# Patient Record
Sex: Male | Born: 1985 | Race: White | Hispanic: No | Marital: Married | Smoking: Never smoker
Health system: Southern US, Community
[De-identification: ages and names within clinical notes are randomized; demographics above are authoritative.]

## PROBLEM LIST (undated history)

## (undated) DIAGNOSIS — K5090 Crohn's disease, unspecified, without complications: Secondary | ICD-10-CM

## (undated) HISTORY — PX: ILEOCECETOMY: SHX5857

---

## 2012-03-13 ENCOUNTER — Other Ambulatory Visit: Payer: Self-pay | Admitting: Occupational Medicine

## 2012-03-13 ENCOUNTER — Ambulatory Visit (HOSPITAL_BASED_OUTPATIENT_CLINIC_OR_DEPARTMENT_OTHER)
Admission: RE | Admit: 2012-03-13 | Discharge: 2012-03-13 | Disposition: A | Discharge status: Home / Self Care | Payer: Self-pay | Source: Ambulatory Visit | Attending: Occupational Medicine | Admitting: Occupational Medicine

## 2012-03-13 DIAGNOSIS — Z0000 Encounter for general adult medical examination without abnormal findings: Secondary | ICD-10-CM | POA: Insufficient documentation

## 2013-01-20 IMAGING — CR DG CHEST 1V
1 series · 1 of 1 positions shown · non-contrast
Comparison: None

CLINICAL DATA: Physical exam.

CHEST - 1 VIEW

[w chest pa]
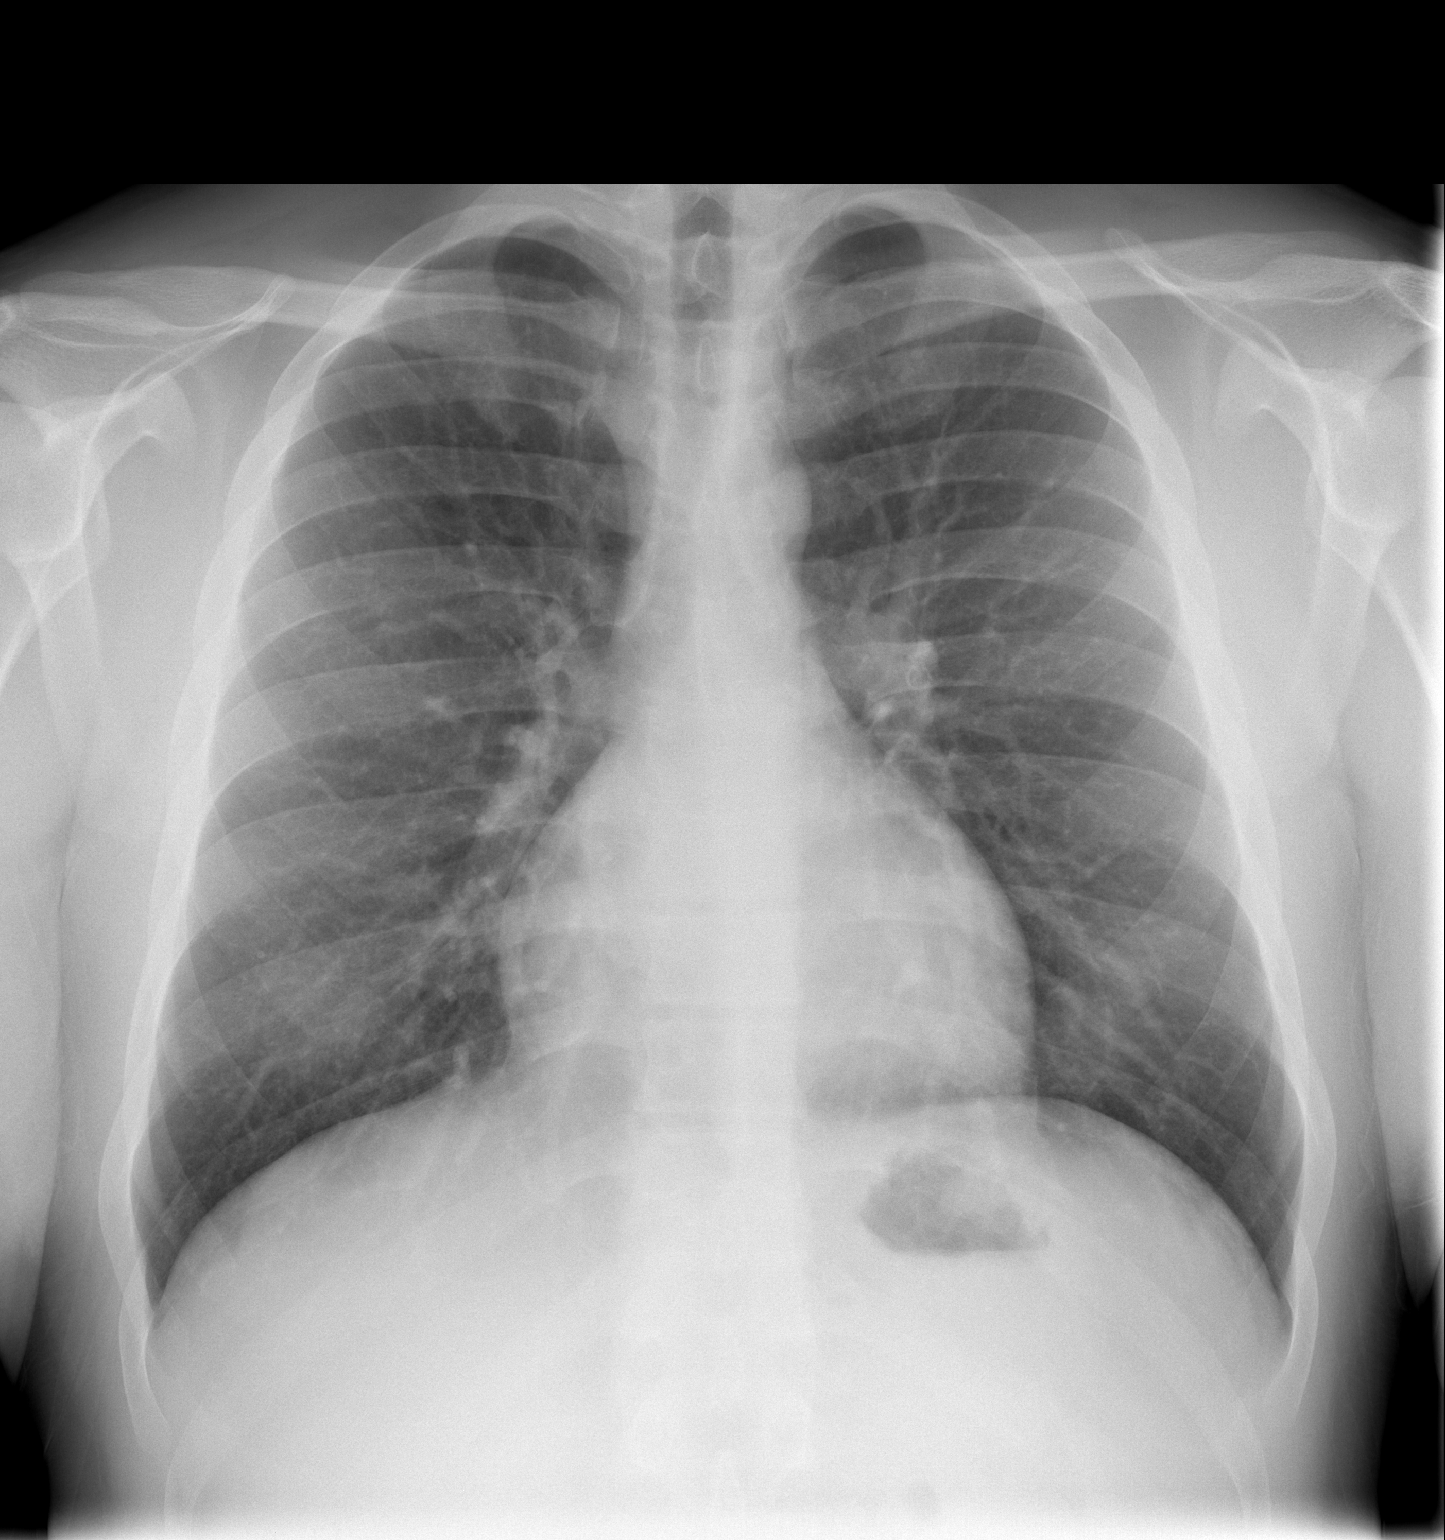

[1 of 1 positions shown; findings below may reference images not displayed]

FINDINGS: Heart and mediastinal contours are within normal limits.
No focal opacities or effusions.  No acute bony abnormality.
IMPRESSION: No active cardiopulmonary disease.

## 2014-07-23 ENCOUNTER — Encounter: Payer: Self-pay | Admitting: Emergency Medicine

## 2014-07-23 ENCOUNTER — Emergency Department
Admission: EM | Admit: 2014-07-23 | Discharge: 2014-07-23 | Disposition: A | Discharge status: Home / Self Care | Payer: BLUE CROSS/BLUE SHIELD | Source: Home / Self Care | Attending: Family Medicine | Admitting: Family Medicine

## 2014-07-23 DIAGNOSIS — J069 Acute upper respiratory infection, unspecified: Secondary | ICD-10-CM

## 2014-07-23 HISTORY — DX: Crohn's disease, unspecified, without complications: K50.90

## 2014-07-23 MED ORDER — GUAIFENESIN-CODEINE 100-10 MG/5ML PO SOLN: 5.0000 mL | Freq: Every evening | ORAL | Status: DC | PRN

## 2014-07-23 MED ORDER — PREDNISONE 10 MG PO TABS: 30.0000 mg | ORAL_TABLET | Freq: Every day | ORAL | Status: DC

## 2014-07-23 MED ORDER — IPRATROPIUM BROMIDE 0.06 % NA SOLN: 2.0000 | Freq: Four times a day (QID) | NASAL | Status: DC

## 2014-07-23 NOTE — ED Provider Notes (Signed)
Brandon Murphy is a 29 y.o. male who presents to [name] today for cough congestion sure throat and runny nose. No shortness of breath. Symptoms present for 4 days. Symptoms consistent with previous episodes of bronchitis. Patient is mildly immunocompromised due to Brandon used to treat Crohn's disease. Typically prednisone is use to alleviate his right otitis that he gets yearly. No wheezing or chest pain palpitations nausea vomiting or diarrhea. Cough is bothersome especially at bedtime.   Past Medical History  Diagnosis Date  . Crohn disease    History reviewed. No pertinent past surgical history. History  Substance Use Topics  . Smoking status: Never Smoker   . Smokeless tobacco: Not on file  . Alcohol Use: No   ROS as above Medications: No current facility-administered medications for this encounter.   Current Outpatient Prescriptions  Medication Sig Dispense Refill  . Certolizumab Pegol (CIMZIA Murphy) Inject into the skin.    Marland Kitchen. omeprazole (PRILOSEC) 10 MG capsule Take 10 mg by mouth daily.    Marland Kitchen. guaiFENesin-codeine 100-10 MG/5ML syrup Take 5 mLs by mouth at bedtime as needed for cough. 120 mL 0  . ipratropium (ATROVENT) 0.06 % nasal spray Place 2 sprays into both nostrils 4 (four) times daily. 15 mL 1  . predniSONE (DELTASONE) 10 MG tablet Take 3 tablets (30 mg total) by mouth daily. 15 tablet 0   Allergies  Allergen Reactions  . Pertussis Vaccines      Exam:  BP 134/78 mmHg  Pulse 73  Temp(Src) 97.4 F (36.3 C) (Oral)  Wt 228 lb (103.42 kg)  SpO2 96% Gen: Well NAD HEENT: EOMI,  MMM posterior pharynx with cobblestoning. Normal tympanic membranes bilaterally. Lungs: Normal work of breathing. CTABL Heart: RRR no MRG Abd: NABS, Soft. Nondistended, Nontender Exts: Brisk capillary refill, warm and well perfused.   No results found for this or any previous visit (from the past 24 hour(s)). No results found.  Assessment and Plan: 29 y.o. male with bronchitis treat  with prednisone and Atrovent nasal spray and codeine cough medication. Follow-up as needed.  Discussed warning signs or symptoms. Please see discharge instructions. Patient expresses understanding.     Rodolph BongEvan S Corey, MD 07/23/14 [phone]

## 2014-07-23 NOTE — ED Notes (Signed)
Pt c/o cough and post nasal drip x4 days. States cough is productive. Has tried Dayquil with minimal relief.

## 2014-07-23 NOTE — Discharge Instructions (Signed)
Thank you for coming in today. °Call or go to the emergency room if you get worse, have trouble breathing, have chest pains, or palpitations.  ° ° °Cough, Adult ° A cough is a reflex that helps clear your throat and airways. It can help heal the body or may be a reaction to an irritated airway. A cough may only last 2 or 3 weeks (acute) or may last more than 8 weeks (chronic).  °CAUSES °Acute cough: °· Viral or bacterial infections. °Chronic cough: °· Infections. °· Allergies. °· Asthma. °· Post-nasal drip. °· Smoking. °· Heartburn or acid reflux. °· Some medicines. °· Chronic lung problems (COPD). °· Cancer. °SYMPTOMS  °· Cough. °· Fever. °· Chest pain. °· Increased breathing rate. °· High-pitched whistling sound when breathing (wheezing). °· Colored mucus that you cough up (sputum). °TREATMENT  °· A bacterial cough may be treated with antibiotic medicine. °· A viral cough must run its course and will not respond to antibiotics. °· Your caregiver may recommend other treatments if you have a chronic cough. °HOME CARE INSTRUCTIONS  °· Only take over-the-counter or prescription medicines for pain, discomfort, or fever as directed by your caregiver. Use cough suppressants only as directed by your caregiver. °· Use a cold steam vaporizer or humidifier in your bedroom or home to help loosen secretions. °· Sleep in a semi-upright position if your cough is worse at night. °· Rest as needed. °· Stop smoking if you smoke. °SEEK IMMEDIATE MEDICAL CARE IF:  °· You have pus in your sputum. °· Your cough starts to worsen. °· You cannot control your cough with suppressants and are losing sleep. °· You begin coughing up blood. °· You have difficulty breathing. °· You develop pain which is getting worse or is uncontrolled with medicine. °· You have a fever. °MAKE SURE YOU:  °· Understand these instructions. °· Will watch your condition. °· Will get help right away if you are not doing well or get worse. °Document Released:  12/18/2010 Document Revised: 09/13/2011 Document Reviewed: 12/18/2010 °[employer]® Patient Information ©2015 [employer]. This information is not intended to replace advice given to you by your health care provider. Make sure you discuss any questions you have with your health care provider. ° °

## 2014-10-05 ENCOUNTER — Encounter: Payer: Self-pay | Admitting: Emergency Medicine

## 2014-10-05 ENCOUNTER — Emergency Department
Admission: EM | Admit: 2014-10-05 | Discharge: 2014-10-05 | Disposition: A | Discharge status: Home / Self Care | Payer: BLUE CROSS/BLUE SHIELD | Source: Home / Self Care | Attending: Emergency Medicine | Admitting: Emergency Medicine

## 2014-10-05 DIAGNOSIS — J302 Other seasonal allergic rhinitis: Secondary | ICD-10-CM | POA: Diagnosis not present

## 2014-10-05 DIAGNOSIS — H1013 Acute atopic conjunctivitis, bilateral: Secondary | ICD-10-CM | POA: Diagnosis not present

## 2014-10-05 DIAGNOSIS — J309 Allergic rhinitis, unspecified: Secondary | ICD-10-CM

## 2014-10-05 MED ORDER — PREDNISONE 10 MG PO TABS: ORAL_TABLET | ORAL | Status: DC

## 2014-10-05 MED ORDER — METHYLPREDNISOLONE ACETATE 80 MG/ML IJ SUSP
80.0000 mg | Freq: Once | INTRAMUSCULAR | Status: AC
  Administered 2014-10-05: 80 mg via INTRAMUSCULAR

## 2014-10-05 NOTE — ED Notes (Signed)
Pt c/o severe seasonal allergies. He uses steroid eye drops and claritin with minimal relief.

## 2014-10-05 NOTE — ED Provider Notes (Signed)
CSN: [id number]     Arrival date & time 10/05/14  1049 History   First MD Initiated Contact with Patient 10/05/14 1119     Chief Complaint  Patient presents with  . Allergies   (Consider location/radiation/quality/duration/timing/severity/associated sxs/prior Treatment) HPI Patient complains of severe seasonal allergies with itchy watery eyes swelling in his face with itch, postnasal drainage and nonproductive cough. No breathing problems. He's not sure if these had wheezing but currently denies wheezing. Complains of sneezing and nasal congestion and serous nasal drainage. No vision change. Has tried Patanol eyedrops and Claritin and that only helped minimally. He states that he usually needs a steroid shot and oral prednisone for 12 days to resolve severe seasonal allergy flareup. Of note, pollen counts are extremely high this week. Past Medical History  Diagnosis Date  . Crohn disease    History reviewed. No pertinent past surgical history. Family History  Problem Relation Age of Onset  . Hypertension Mother   . CAD Mother    History  Substance Use Topics  . Smoking status: Never Smoker   . Smokeless tobacco: Not on file  . Alcohol Use: No    Review of Systems  All other systems reviewed and are negative.   Allergies  Pertussis vaccines  Home Medications   Prior to Admission medications   Medication Sig Start Date End Date Taking? Authorizing Provider  Certolizumab Pegol (CIMZIA [place]) Inject into the skin.    Historical Provider, MD  guaiFENesin-codeine 100-10 MG/5ML syrup Take 5 mLs by mouth at bedtime as needed for cough. 07/23/14   Rodolph Bong, MD  ipratropium (ATROVENT) 0.06 % nasal spray Place 2 sprays into both nostrils 4 (four) times daily. 07/23/14   Rodolph Bong, MD  omeprazole (PRILOSEC) 10 MG capsule Take 10 mg by mouth daily.    Historical Provider, MD  predniSONE (DELTASONE) 10 MG tablet Days 1 & 2, take 6 daily. Days 3 & 4, take 5 daily. Days 5 & 6: 4  daily. Days 7 &8: 3 daily. Days 9 &10: 2 daily. Days 11 &12: 1 daily. 10/05/14   Lajean Manes, MD   BP 100/66 mmHg  Pulse 76  Temp(Src) 97.8 F (36.6 C) (Oral)  Wt 230 lb (104.327 kg)  SpO2 98% Physical Exam  Constitutional: He is oriented to person, place, and time. He appears well-developed and well-nourished. No distress.  Face is mildly erythematous and minimal diffuse swelling. No lip swelling. Oropharynx clear, airway intact.  HENT:  Head: Normocephalic and atraumatic.  TMs normal. Nose with pale boggy terminates serous drainage. Pharynx with lymphoid hyperplasia and serous postnasal drainage.  Eyes: EOM are normal. Pupils are equal, round, and reactive to light. No scleral icterus.  Conjunctiva mildly inflamed without exudate. Visual acuity grossly intact bilaterally  Neck: Normal range of motion. Neck supple. No JVD present. No tracheal deviation present.  Cardiovascular: Normal rate, regular rhythm and normal heart sounds.   No murmur heard. Pulmonary/Chest: Effort normal and breath sounds normal. No stridor. He has no wheezes.  Abdominal: He exhibits no distension and no mass. There is no tenderness. There is no rebound and no guarding.  Musculoskeletal: Normal range of motion. He exhibits no tenderness.  Lymphadenopathy:    He has no cervical adenopathy.  Neurological: He is alert and oriented to person, place, and time. No cranial nerve deficit.  Skin: Skin is warm. No rash noted.  Psychiatric: He has a normal mood and affect.  Nursing note and vitals reviewed.  ED Course  Procedures (including critical care time) Labs Review Labs Reviewed - No data to display  Imaging Review No results found.   MDM   1. Seasonal allergies   2. Allergic conjunctivitis and rhinitis, bilateral    severe. No sign of infection  Risks, benefits, alternatives discussed. Treatment options discussed, as well as risks, benefits, alternatives. Patient voiced understanding and  agreement with the following plans: New Prescriptions   PREDNISONE (DELTASONE) 10 MG TABLET    Days 1 & 2, take 6 daily. Days 3 & 4, take 5 daily. Days 5 & 6: 4 daily. Days 7 &8: 3 daily. Days 9 &10: 2 daily. Days 11 &12: 1 daily.   Depo-Medrol 80 mg IM stat Follow-up with your allergist and/or primary care doctor in 5-7 days if not improving, or sooner if symptoms become worse. Precautions discussed. Red flags discussed. Questions invited and answered. Patient voiced understanding and agreement.    Lajean Manesavid Massey, MD 10/05/14 [phone]

## 2015-03-18 ENCOUNTER — Other Ambulatory Visit: Payer: Self-pay | Admitting: Family Medicine

## 2017-06-15 ENCOUNTER — Telehealth: Payer: Self-pay | Admitting: *Deleted

## 2017-06-15 ENCOUNTER — Emergency Department
Admission: EM | Admit: 2017-06-15 | Discharge: 2017-06-15 | Disposition: A | Discharge status: Home / Self Care | Payer: Managed Care, Other (non HMO) | Source: Home / Self Care | Attending: Family Medicine | Admitting: Family Medicine

## 2017-06-15 ENCOUNTER — Encounter: Payer: Self-pay | Admitting: *Deleted

## 2017-06-15 ENCOUNTER — Other Ambulatory Visit: Payer: Self-pay

## 2017-06-15 DIAGNOSIS — H6502 Acute serous otitis media, left ear: Secondary | ICD-10-CM | POA: Diagnosis not present

## 2017-06-15 DIAGNOSIS — J302 Other seasonal allergic rhinitis: Secondary | ICD-10-CM

## 2017-06-15 DIAGNOSIS — J069 Acute upper respiratory infection, unspecified: Secondary | ICD-10-CM

## 2017-06-15 DIAGNOSIS — B9789 Other viral agents as the cause of diseases classified elsewhere: Secondary | ICD-10-CM

## 2017-06-15 MED ORDER — METHYLPREDNISOLONE SODIUM SUCC 125 MG IJ SOLR
80.0000 mg | Freq: Once | INTRAMUSCULAR | Status: AC
  Administered 2017-06-15: 80 mg via INTRAMUSCULAR

## 2017-06-15 MED ORDER — PREDNISONE 20 MG PO TABS
ORAL_TABLET | ORAL | 0 refills | Status: DC
Start: 2017-06-15 — End: 2018-06-18

## 2017-06-15 MED ORDER — DOXYCYCLINE HYCLATE 100 MG PO CAPS: 100.0000 mg | ORAL_CAPSULE | Freq: Two times a day (BID) | ORAL | 0 refills | Status: DC

## 2017-06-15 MED ORDER — BECLOMETHASONE DIPROPIONATE 80 MCG/ACT NA AERS: INHALATION_SPRAY | NASAL | 1 refills | Status: AC

## 2017-06-15 MED ORDER — GUAIFENESIN-CODEINE 100-10 MG/5ML PO SOLN: ORAL | 0 refills | Status: AC

## 2017-06-15 MED ORDER — IPRATROPIUM BROMIDE 0.03 % NA SOLN: NASAL | 2 refills | Status: AC

## 2017-06-15 NOTE — Discharge Instructions (Signed)
Begin prednisone Thursday 06/16/17. Take plain guaifenesin (1200mg  extended release tabs such as Mucinex) twice daily, with plenty of water, for cough and congestion.  May add Pseudoephedrine (30mg , one or two every 4 to 6 hours) for sinus congestion.  Get adequate rest.   May use Afrin nasal spray (or generic oxymetazoline) each morning for about 5 days and then discontinue.  Also recommend using saline nasal spray several times daily and saline nasal irrigation (AYR is a common brand).  Use QNASL nasal spray each morning after using Afrin nasal spray and saline nasal irrigation. Try warm salt water gargles for sore throat.  Stop all antihistamines for now, and other non-prescription cough/cold preparations.

## 2017-06-15 NOTE — Telephone Encounter (Signed)
Received a fax from pts pharmacy stating his rx for Qnasal was rejected by his insurance. Called to inform the pt per dr Cathren Harshbeese he should get OTC Flonase instead. Pt verbalized understanding.

## 2017-06-15 NOTE — ED Triage Notes (Signed)
Pt c/o nasal congestion, runny nose and productive cough x 1 wk. Denies fever.

## 2017-06-15 NOTE — ED Provider Notes (Signed)
[name]    CSN: [id number] Arrival date & time: 06/15/17  1116     History   Chief Complaint Chief Complaint  Patient presents with  . Nasal Congestion    HPI Brandon Murphy is a 31 y.o. male.   Patient has a history of perennial rhinitis.  His usual sinus congestion became worse about one week ago with development of sore throat, increased sinus pressure, ear fullness, and non-productive cough worse at night.  He has had chills and mild myalgias. He has a history of recurring sinusitis.   The history is provided by the patient.    Past Medical History:  Diagnosis Date  . Crohn disease (HCC)     There are no active problems to display for this patient.   Past Surgical History:  Procedure Laterality Date  . ILEOCECETOMY         Home Medications    Prior to Admission medications   Medication Sig Start Date End Date Taking? Authorizing Provider  inFLIXimab (REMICADE) 100 MG injection Inject into the vein.   Yes [provider]  methotrexate (RHEUMATREX) 2.5 MG tablet Take 2.5 mg by mouth once a week. Caution:Chemotherapy. Protect from light.   Yes [provider]  omeprazole (PRILOSEC) 10 MG capsule Take 10 mg by mouth daily.   Yes [provider]  Beclomethasone Dipropionate (QNASL) 80 MCG/ACT AERS Place 2 sprays in each nostril once daily 06/15/17   Lattie Haw, MD  doxycycline (VIBRAMYCIN) 100 MG capsule Take 1 capsule (100 mg total) by mouth 2 (two) times daily. Take with food. 06/15/17   Lattie Haw, MD  guaiFENesin-codeine 100-10 MG/5ML syrup Take 10mL by mouth at bedtime as needed for cough.  May repeat dose in 4 to 6 hours. 06/15/17   Lattie Haw, MD  ipratropium (ATROVENT) 0.03 % nasal spray Place 2 sprays in each nostril BID 06/15/17   Lattie Haw, MD  predniSONE (DELTASONE) 20 MG tablet Take one tab by mouth twice daily for 5 days, then one daily for 5 days. Take with food. 06/15/17    Lattie Haw, MD    Family History Family History  Problem Relation Age of Onset  . Hypertension Mother   . CAD Mother     Social History Social History   Tobacco Use  . Smoking status: Never Smoker  . Smokeless tobacco: Never Used  Substance Use Topics  . Alcohol use: No  . Drug use: No     Allergies   Pertussis vaccines   Review of Systems Review of Systems + sore throat + cough No pleuritic pain No wheezing + nasal congestion + post-nasal drainage + sinus pain/pressure No itchy/red eyes ? earache No hemoptysis No SOB No fever, + chills No nausea No vomiting No abdominal pain No diarrhea No urinary symptoms No skin rash + fatigue + myalgias + headache Used OTC meds without relief   Physical Exam Triage Vital Signs ED Triage Vitals  Enc Vitals Group     BP 06/15/17 1137 (!) 165/82     Pulse Rate 06/15/17 1137 70     Resp 06/15/17 1137 16     Temp 06/15/17 1137 98.1 F (36.7 C)     Temp Source 06/15/17 1137 Oral     SpO2 06/15/17 1137 99 %     Weight 06/15/17 1137 223 lb (101.2 kg)     Height 06/15/17 1137 6' (1.829 m)     Head Circumference --  Peak Flow --      Pain Score 06/15/17 1138 0     Pain Loc --      Pain Edu? --      Excl. in GC? --    No data found.  Updated Vital Signs BP (!) 165/82 (BP Location: Right Arm)   Pulse 70   Temp 98.1 F (36.7 C) (Oral)   Resp 16   Ht 6' (1.829 m)   Wt 223 lb (101.2 kg)   SpO2 99%   BMI 30.24 kg/m   Visual Acuity Right Eye Distance:   Left Eye Distance:   Bilateral Distance:    Right Eye Near:   Left Eye Near:    Bilateral Near:     Physical Exam Nursing notes and Vital Signs reviewed. Appearance:  Patient appears stated age, and in no acute distress Eyes:  Pupils are equal, round, and reactive to light and accomodation.  Extraocular movement is intact.  Conjunctivae are not inflamed  Ears:  Canals normal.  Right tympanic membrane normal; left tympanic membrane has  serous effusion. Nose:  Congested turbinates.    Maxillary and frontal sinus tenderness is present.  Pharynx:  Normal Neck:  Supple.  Enlarged posterior/lateral nodes are palpated bilaterally, tender to palpation on the left.   Lungs:  Clear to auscultation.  Breath sounds are equal.  Moving air well. Heart:  Regular rate and rhythm without murmurs, rubs, or gallops.  Abdomen:  Nontender without masses or hepatosplenomegaly.  Bowel sounds are present.  No CVA or flank tenderness.  Extremities:  No edema.  Skin:  No rash present.    UC Treatments / Results  Labs (all labs ordered are listed, but only abnormal results are displayed) Labs Reviewed - No data to display  EKG  EKG Interpretation None       Radiology No results found.  Procedures Procedures (including critical care time)  Medications Ordered in UC Medications  methylPREDNISolone sodium succinate (SOLU-MEDROL) 125 mg/2 mL injection 80 mg (80 mg Intramuscular Given 06/15/17 1154)     Initial Impression / Assessment and Plan / UC Course  I have reviewed the triage vital signs and the nursing notes.  Pertinent labs & imaging results that were available during my care of the patient were reviewed by me and considered in my medical decision making (see chart for details).    Patient now has congestion of a viral URI superimposed on his usual seasonal and perennial rhinitis.    Administered Solumedrol 80mg  IM Begin empiric doxycycline 100mg  BID. Refil QNASL and Atrovent nasal sprays. Rx for Robitussin AC for night time cough.  Controlled Substance Prescriptions I have consulted the [place] Controlled Substances Registry for this patient, and feel the risk/benefit ratio today is favorable for proceeding with this prescription for a controlled substance.  Begin prednisone burst/taper Thursday 06/16/17. Take plain guaifenesin (1200mg  extended release tabs such as Mucinex) twice daily, with plenty of water, for cough and  congestion.  May add Pseudoephedrine (30mg , one or two every 4 to 6 hours) for sinus congestion.  Get adequate rest.   May use Afrin nasal spray (or generic oxymetazoline) each morning for about 5 days and then discontinue.  Also recommend using saline nasal spray several times daily and saline nasal irrigation (AYR is a common brand).  Use QNASL nasal spray each morning after using Afrin nasal spray and saline nasal irrigation. Try warm salt water gargles for sore throat.  Stop all antihistamines for now, and other non-prescription  cough/cold preparations.    Final Clinical Impressions(s) / UC Diagnoses   Final diagnoses:  Acute serous otitis media of left ear, recurrence not specified  Viral URI with cough  Seasonal allergic rhinitis, unspecified trigger    ED Discharge Orders        Ordered    predniSONE (DELTASONE) 20 MG tablet     06/15/17 1156    doxycycline (VIBRAMYCIN) 100 MG capsule  2 times daily     06/15/17 1156    Beclomethasone Dipropionate (QNASL) 80 MCG/ACT AERS     06/15/17 1156    ipratropium (ATROVENT) 0.03 % nasal spray     06/15/17 1156    guaiFENesin-codeine 100-10 MG/5ML syrup     06/15/17 1156           Lattie HawBeese, Candice Tobey A, MD 06/15/17 1203

## 2018-06-18 ENCOUNTER — Encounter: Payer: Self-pay | Admitting: Emergency Medicine

## 2018-06-18 ENCOUNTER — Emergency Department
Admission: EM | Admit: 2018-06-18 | Discharge: 2018-06-18 | Disposition: A | Discharge status: Home / Self Care | Payer: Managed Care, Other (non HMO) | Source: Home / Self Care

## 2018-06-18 DIAGNOSIS — J019 Acute sinusitis, unspecified: Secondary | ICD-10-CM | POA: Diagnosis not present

## 2018-06-18 MED ORDER — DOXYCYCLINE HYCLATE 100 MG PO CAPS: 100.0000 mg | ORAL_CAPSULE | Freq: Two times a day (BID) | ORAL | 0 refills | Status: AC

## 2018-06-18 MED ORDER — METHYLPREDNISOLONE SODIUM SUCC 40 MG IJ SOLR
80.0000 mg | Freq: Once | INTRAMUSCULAR | Status: AC
  Administered 2018-06-18: 80 mg via INTRAMUSCULAR

## 2018-06-18 MED ORDER — PREDNISONE 20 MG PO TABS: ORAL_TABLET | ORAL | 0 refills | Status: AC

## 2018-06-18 NOTE — Discharge Instructions (Signed)
  You may take 500mg acetaminophen every 4-6 hours or in combination with ibuprofen 400-600mg every 6-8 hours as needed for pain, inflammation, and fever.  Be sure to well hydrated with clear liquids and get at least 8 hours of sleep at night, preferably more while sick.   Please follow up with family medicine in 1 week if needed.   

## 2018-06-18 NOTE — ED Triage Notes (Signed)
Pt c/o facial pain and sinus pressure x1 week. Taking sudafed. Afebrile.

## 2018-06-18 NOTE — ED Provider Notes (Signed)
[facility]    CSN: [id number] Arrival date & time: 06/18/18  1751     History   Chief Complaint Chief Complaint  Patient presents with  . Facial Pain    HPI Brandon Murphy is a 32 y.o. male.   HPI Brandon Murphy is a 32 y.o. male presenting to UC with c/o sinus congestion with pain and pressure that started about 1-2 weeks ago. Hx of "annual" sinus infection. Pt does have a hx of crohns and is on immunosuppressants.  He has taken sudafed and used his netti pot w/o relief. Pain is 5/10 but he has not taken any pain medication including no tylenol or motrin. Denies n/v/d. No fever or chills. No known sick contacts.     Past Medical History:  Diagnosis Date  . Crohn disease (HCC)     There are no active problems to display for this patient.   Past Surgical History:  Procedure Laterality Date  . ILEOCECETOMY         Home Medications    Prior to Admission medications   Medication Sig Start Date End Date Taking? Authorizing Provider  Beclomethasone Dipropionate (QNASL) 80 MCG/ACT AERS Place 2 sprays in each nostril once daily 06/15/17   Lattie HawBeese, Stephen A, MD  doxycycline (VIBRAMYCIN) 100 MG capsule Take 1 capsule (100 mg total) by mouth 2 (two) times daily. One po bid x 7 days 06/18/18   Lurene ShadowPhelps, Jaquis Picklesimer O, PA-C  guaiFENesin-codeine 100-10 MG/5ML syrup Take 10mL by mouth at bedtime as needed for cough.  May repeat dose in 4 to 6 hours. 06/15/17   Lattie HawBeese, Stephen A, MD  inFLIXimab (REMICADE) 100 MG injection Inject into the vein.    [provider]  ipratropium (ATROVENT) 0.03 % nasal spray Place 2 sprays in each nostril BID 06/15/17   Lattie HawBeese, Stephen A, MD  methotrexate (RHEUMATREX) 2.5 MG tablet Take 2.5 mg by mouth once a week. Caution:Chemotherapy. Protect from light.    [provider]  omeprazole (PRILOSEC) 10 MG capsule Take 10 mg by mouth daily.    [provider]  predniSONE (DELTASONE) 20 MG tablet 3 tabs po day one, then 2  po daily x 4 days 06/18/18   Lurene ShadowPhelps, Terry Abila O, PA-C    Family History Family History  Problem Relation Age of Onset  . Hypertension Mother   . CAD Mother     Social History Social History   Tobacco Use  . Smoking status: Never Smoker  . Smokeless tobacco: Never Used  Substance Use Topics  . Alcohol use: No  . Drug use: No     Allergies   Pertussis vaccines   Review of Systems Review of Systems  Constitutional: Negative for chills and fever.  HENT: Positive for congestion, sinus pressure and sinus pain. Negative for ear pain, sore throat, trouble swallowing and voice change.   Respiratory: Negative for cough ( minimal) and shortness of breath.   Cardiovascular: Negative for chest pain and palpitations.  Gastrointestinal: Negative for abdominal pain, diarrhea, nausea and vomiting.  Musculoskeletal: Negative for arthralgias, back pain and myalgias.  Skin: Negative for rash.  Neurological: Positive for headaches (frontal). Negative for dizziness and light-headedness.     Physical Exam Triage Vital Signs ED Triage Vitals  Enc Vitals Group     BP      Pulse      Resp      Temp      Temp src      SpO2  Weight      Height      Head Circumference      Peak Flow      Pain Score      Pain Loc      Pain Edu?      Excl. in GC?    No data found.  Updated Vital Signs BP 115/73 (BP Location: Right Arm)   Pulse 98   Temp 98.1 F (36.7 C) (Oral)   Wt 216 lb (98 kg)   SpO2 97%   BMI 29.29 kg/m   Visual Acuity Right Eye Distance:   Left Eye Distance:   Bilateral Distance:    Right Eye Near:   Left Eye Near:    Bilateral Near:     Physical Exam Vitals signs and nursing note reviewed.  Constitutional:      Appearance: Normal appearance. He is well-developed.  HENT:     Head: Normocephalic and atraumatic.     Right Ear: Tympanic membrane normal.     Left Ear: Tympanic membrane normal.     Nose:     Left Sinus: Maxillary sinus tenderness and frontal  sinus tenderness present.     Mouth/Throat:     Lips: Pink.     Pharynx: Oropharynx is clear.  Neck:     Musculoskeletal: Normal range of motion.  Cardiovascular:     Rate and Rhythm: Normal rate and regular rhythm.  Pulmonary:     Effort: Pulmonary effort is normal. No respiratory distress.     Breath sounds: Normal breath sounds. No stridor. No wheezing or rhonchi.  Musculoskeletal: Normal range of motion.  Skin:    General: Skin is warm and dry.  Neurological:     Mental Status: He is alert and oriented to person, place, and time.  Psychiatric:        Behavior: Behavior normal.      UC Treatments / Results  Labs (all labs ordered are listed, but only abnormal results are displayed) Labs Reviewed - No data to display  EKG None  Radiology No results found.  Procedures Procedures (including critical care time)  Medications Ordered in UC Medications  methylPREDNISolone sodium succinate (SOLU-MEDROL) 40 mg/mL injection 80 mg (80 mg Intramuscular Given 06/18/18 1820)    Initial Impression / Assessment and Plan / UC Course  I have reviewed the triage vital signs and the nursing notes.  Pertinent labs & imaging results that were available during my care of the patient were reviewed by me and considered in my medical decision making (see chart for details).     Will tx for sinusitis Pt has done well with steroids and doxycycline in the past  F/u with PCP and/or ENT  Final Clinical Impressions(s) / UC Diagnoses   Final diagnoses:  Acute rhinosinusitis     Discharge Instructions      You may take 500mg  acetaminophen every 4-6 hours or in combination with ibuprofen 400-600mg  every 6-8 hours as needed for pain, inflammation, and fever.  Be sure to well hydrated with clear liquids and get at least 8 hours of sleep at night, preferably more while sick.   Please follow up with family medicine in 1 week if needed.     ED Prescriptions    Medication Sig  Dispense Auth. Provider   doxycycline (VIBRAMYCIN) 100 MG capsule Take 1 capsule (100 mg total) by mouth 2 (two) times daily. One po bid x 7 days 14 capsule Dylanie Quesenberry O, PA-C   predniSONE (DELTASONE) 20  MG tablet 3 tabs po day one, then 2 po daily x 4 days 11 tablet Lurene Shadow, PA-C     Controlled Substance Prescriptions [place] Controlled Substance Registry consulted? Not Applicable   Rolla Plate 06/18/18 1825
